# Patient Record
Sex: Male | Born: 1950 | Race: White | Hispanic: No | Marital: Married | Smoking: Current every day smoker
Health system: Southern US, Community
[De-identification: ages and names within clinical notes are randomized; demographics above are authoritative.]

## PROBLEM LIST (undated history)

## (undated) DIAGNOSIS — G2581 Restless legs syndrome: Secondary | ICD-10-CM

## (undated) DIAGNOSIS — E785 Hyperlipidemia, unspecified: Secondary | ICD-10-CM

## (undated) DIAGNOSIS — K219 Gastro-esophageal reflux disease without esophagitis: Secondary | ICD-10-CM

## (undated) DIAGNOSIS — C801 Malignant (primary) neoplasm, unspecified: Secondary | ICD-10-CM

## (undated) DIAGNOSIS — C4490 Unspecified malignant neoplasm of skin, unspecified: Secondary | ICD-10-CM

## (undated) DIAGNOSIS — E079 Disorder of thyroid, unspecified: Secondary | ICD-10-CM

## (undated) DIAGNOSIS — E039 Hypothyroidism, unspecified: Secondary | ICD-10-CM

## (undated) HISTORY — PX: TONSILLECTOMY: SUR1361

## (undated) HISTORY — PX: THYROID SURGERY: SHX805

## (undated) HISTORY — PX: OTHER SURGICAL HISTORY: SHX169

---

## 2005-04-21 ENCOUNTER — Ambulatory Visit: Payer: Self-pay | Admitting: Gastroenterology

## 2012-09-16 ENCOUNTER — Ambulatory Visit: Payer: Self-pay | Admitting: Emergency Medicine

## 2012-09-16 DIAGNOSIS — Z0000 Encounter for general adult medical examination without abnormal findings: Secondary | ICD-10-CM

## 2012-09-16 LAB — DOT URINE DIP
Blood: NEGATIVE
Glucose,UR: NEGATIVE mg/dL (ref 0–75)
Protein: NEGATIVE
Specific Gravity: 1.02 (ref 1.003–1.030)

## 2013-02-13 ENCOUNTER — Emergency Department: Payer: Self-pay | Admitting: Emergency Medicine

## 2013-02-28 ENCOUNTER — Emergency Department: Payer: Self-pay | Admitting: Emergency Medicine

## 2013-07-21 ENCOUNTER — Ambulatory Visit: Payer: Self-pay

## 2013-12-29 ENCOUNTER — Ambulatory Visit: Payer: Self-pay

## 2013-12-29 LAB — DOT URINE DIP
BLOOD: NEGATIVE
Glucose,UR: NEGATIVE
Protein: NEGATIVE
Specific Gravity: 1.02 (ref 1.000–1.030)

## 2014-02-26 ENCOUNTER — Ambulatory Visit: Payer: Self-pay | Admitting: Otolaryngology

## 2014-03-25 ENCOUNTER — Ambulatory Visit: Payer: Self-pay | Admitting: Otolaryngology

## 2014-04-08 ENCOUNTER — Ambulatory Visit: Payer: Self-pay | Admitting: Otolaryngology

## 2014-05-06 ENCOUNTER — Ambulatory Visit: Payer: Self-pay | Admitting: Internal Medicine

## 2014-06-15 LAB — SURGICAL PATHOLOGY

## 2014-06-21 NOTE — Op Note (Signed)
PATIENT NAME:  [name], [name] MR#:  [id number] DATE OF BIRTH:  25-Sep-1950  DATE OF PROCEDURE:  03/25/2014  PREOPERATIVE DIAGNOSES:  1. Left parotid neoplasm.  2. Left thyroid nodule.   POSTOPERATIVE DIAGNOSES:  1. Left parotid neoplasm.  2. Left thyroid nodule.   PROCEDURES: 1. Left superficial parotidectomy with facial nerve dissection and facial nerve monitoring.  2. Ultrasound-guided fine needle aspiration of left thyroid nodule.   SURGEON: Sammuel Hines. Richardson Landry, MD   FIRST ASSISTANT: Huey Romans, MD   ANESTHESIA: General endotracheal.   INDICATIONS: A 64 year old male with a left parotid mass with needle aspiration indicating probable benign pathology. He also had an incidental finding of a 1.3 cm left thyroid nodule with microcalcifications on his CT scan.   FINDINGS: The parotid mass was approximately 2 x 1.3 cm and located in the left tail of the parotid. No other nodules were seen. The left thyroid nodule was approximately 1.3 cm and successfully aspirated with fine needle under ultrasound guidance. Adequate tissue sampling was confirmed by the pathology tech.   COMPLICATIONS: None.   DESCRIPTION OF PROCEDURE: After obtaining informed consent, the patient was taken to the operating room and placed in the supine position. After induction of general endotracheal anesthesia, the patient was turned 90 degrees, and the facial nerve monitor electrodes were placed in the usual fashion and proper functioning confirmed. Lidocaine 1% with epinephrine 1:200,000 was injected into the skin along the proposed incision line. He was then prepped and draped in the usual sterile fashion. A 15 blade was used to incise the skin in a gentle S-shaped incision, going from anterior to the ear, around the earlobe, and curving gently into the neck. The incision was carried down to the subcutaneous fatty tissue with the Bovie. The sternocleidomastoid muscle was identified inferiorly and dissection proceeded  along the anterior border of the sternocleidomastoid, dissecting the parotid tissue away from the muscle and away from the region of the mastoid tip. A skin flap was then elevated above the SMAS using blunt scissors. The skin flap was retracted forward and was elevated just anterior to the palpable mass in the tail of parotid tissue. The parotid gland was then dissected away from the tragal cartilage and mastoid tip, monitoring facial nerve stimulation during the entire dissection. Dissection proceeded down to the region of the stylomastoid foramen, was adequately exposed, and careful dissection identified the trunk of the facial nerve. This was confirmed with a nerve stimulator. Sequential dissection out along the length of the facial nerve was then performed, identifying inferior nerve branches and dissecting them out under the mass and dividing the mass and superficial parotid tissue from the deeper lobe parotid tissue with the Harmonic scalpel. Care was taken throughout the dissection to identify the lower branches of the facial nerve prior and protecting prior to dividing any of the parotid tissue. Once the dissection had proceeded out beyond the mass itself, the parotid tissue was then divided anterior to the mass and sent as a specimen. There was no need to dissect the upper and middle facial nerve branches because of the mass being located so low in the tail of parotid. The wound was irrigated and the parotid fascia tacked back against the sternocleidomastoid muscle with 4-0 Vicryl to help close the dead space. A #10 TLS drain was placed through the skin and secured with 5-0 Prolene suture. The subcutaneous tissues were then closed with 4-0 Vicryl in an interrupted fashion, and the skin closed with 5-0 Prolene  suture in a running lock stitch. Bacitracin ointment was then applied.   Next, the neck was palpated. I can vaguely feel the mass on the left side of the thyroid gland, but not adequately enough to  accurately perform a needle aspiration. The skin was prepped with the ultrasound gel and the ultrasound used to identify the nodule within the parotid gland. The carotid was identified lateral to this. The nodule appeared to be about 1.3 cm in size. The nodule was then aspirated under ultrasound guidance with a 22-gauge needle. Three separate aspirations were made under direct ultrasound guidance. The tissue was given to the pathologist and inspected to confirm adequate tissue sampling. Follicular cells and colloid were seen, confirming adequacy of the specimen. Pressure was held over the area and there was minimal bruising. The patient was then returned to the anesthesiologist for awakening. He was awakened and taken to the recovery room in good condition postoperatively. At the completion of the skin closure, the drain was hooked up to a Hemovac tube for suction.   ESTIMATED BLOOD LOSS: Less than 50 mL.   ____________________________ Sammuel Hines. Richardson Landry, MD psb:mw D: 03/25/2014 10:04:22 ET T: 03/25/2014 13:15:29 ET JOB#: [id number]  cc: Sammuel Hines. Richardson Landry, MD, <Dictator> Riley Nearing MD ELECTRONICALLY SIGNED 04/22/2014 11:59

## 2014-06-21 NOTE — Op Note (Signed)
PATIENT NAME:  [name] [name], [name] [name] MR#:  [id number] DATE OF BIRTH:  Jun 17, 1950  DATE OF PROCEDURE:  04/08/2014  PREOPERATIVE DIAGNOSIS: Left thyroid nodule.   POSTOPERATIVE DIAGNOSIS: Left thyroid nodule.   PROCEDURE: Total thyroidectomy.   SURGEON:  Malon Kindle, MD  FIRST ASSISTANT: Beverly Gust, MD   ANESTHESIA: General endotracheal.   INDICATIONS: The patient with a history of left thyroid nodule noted incidentally on CT imaging after scan was ordered to evaluate a left parotid mass. The nodule had associated calcifications and fine needle aspiration was suspicious for papillary thyroid carcinoma. After discussion with the patient he has elected to proceed with total thyroidectomy to reduce any possible need for reoperation in the future.   FINDINGS: 13 mm firm nodule in the superior pole of the left thyroid gland. No other palpable nodules.   COMPLICATIONS: None.   DESCRIPTION OF PROCEDURE: After obtaining informed consent, the patient was taken to the operating room and placed in the supine position. After induction of general endotracheal anesthesia, the patient was placed on a shoulder roll with the neck extended and 1% lidocaine with epinephrine 1:100,000 injected into the skin overlying the thyroid gland. He was then prepped and draped in the usual sterile fashion. During intubation he was intubated with an endotracheal tube with the nerve monitor attached and I directly visualized electrode placement by the anesthesiologist to confirm it was in good position. A 15 blade was used to incise the skin and the incision carried down through the platysma with the Bovie. An anterior jugular vein was clamped and suture ligated and the strap muscles divided in the midline with the Bovie and retracted laterally. The left gland was dissected first, dissecting the strap muscles away from the thyroid gland and working laterally around the gland, dissecting soft tissue attachments and dividing using  the Harmonic scalpel. Dissection proceeded inferiorly dividing the inferior thyroid vascular pedicle with the Harmonic scalpel. A parathyroid gland was easily identified in this area and preserved with its vascular pedicle. Dissecting around laterally, the middle thyroid veins were divided with the Harmonic scalpel and dissection proceeded superiorly where the superior vascular pedicle was dissected out and divided with the Harmonic scalpel. This allowed the thyroid to be rotated medially and careful dissection revealed the recurrent laryngeal nerve. This was confirmed by stimulating the nerve. The nerve was then dissected up superiorly to where it entered the larynx, allowing the remainder of the thyroid gland to be dissected away from the trachea and Berry's ligament with the nerve directly visualized and protected. Attention then turned to the opposite side where the same procedure was performed, again dissecting the gland away from the strap muscles and surrounding structures using the Harmonic scalpel and dividing vascular attachments inferiorly and superiorly. During the dissection of the glands 3 parathyroid glands were identified, the 2 parathyroid glands on the left as well as a superior parathyroid on the right. I did not identify an inferior parathyroid gland, however, the specimen was inspected and there was no parathyroid tissue associated with it that could be visualized. There was most likely some parathyroid remaining in an area of fatty tissue in the region of the lower pole of the right gland. The right gland was dissected away from the trachea and Berry's ligament divided after identifying the recurrent laryngeal nerve and tracing it up to its entrance into the larynx. The full specimen was removed and sent with the superior pole of the left thyroid gland marked with a stitch where the nodule  was located. The nodule itself appeared to be confined to the thyroid gland. There was no evidence of any  peritracheal lymphadenopathy. The wound was irrigated and minor bleeding controlled with the bipolar cautery. The recurrent laryngeal nerves were restimulated and appeared to function appropriately. A small amount of Surgicel was placed over the region of Berry's ligament bilaterally where there was some minimal oozing.  Number 10 TLS drains were then placed through the skin and sutured into position with the 4-0 Vicryl suture. The strap muscles were reapproximated with 4-0 Vicryl. The subcutaneous tissues were then closed with 4-0 Vicryl in an interrupted fashion. The skin was closed with 3-0 Prolene suture in a running subcuticular stitch. The drains were hooked up to their Vacu-tube suctions and the patient returned to the anesthesiologist for awakening. He was awakened and taken to the recovery room in good condition postoperatively. Blood loss was less than 25 mL.   ____________________________ Sammuel Hines. Richardson Landry, MD psb:mc D: 04/09/2014 08:11:08 ET T: 04/09/2014 08:33:09 ET JOB#: [id number]  cc: Sammuel Hines. Richardson Landry, MD, <Dictator> Riley Nearing MD ELECTRONICALLY SIGNED 04/22/2014 12:00

## 2015-01-28 ENCOUNTER — Encounter: Payer: Self-pay | Admitting: Emergency Medicine

## 2015-01-28 ENCOUNTER — Ambulatory Visit
Admission: EM | Admit: 2015-01-28 | Discharge: 2015-01-28 | Disposition: A | Discharge status: Home / Self Care | Payer: PRIVATE HEALTH INSURANCE | Attending: Family Medicine | Admitting: Family Medicine

## 2015-01-28 DIAGNOSIS — Z024 Encounter for examination for driving license: Secondary | ICD-10-CM

## 2015-01-28 DIAGNOSIS — Z021 Encounter for pre-employment examination: Secondary | ICD-10-CM

## 2015-01-28 HISTORY — DX: Disorder of thyroid, unspecified: E07.9

## 2015-01-28 HISTORY — DX: Restless legs syndrome: G25.81

## 2015-01-28 LAB — DEPT OF TRANSP DIPSTICK, URINE (ARMC ONLY)
Glucose, UA: NEGATIVE mg/dL
Hgb urine dipstick: NEGATIVE
Protein, ur: NEGATIVE mg/dL
Specific Gravity, Urine: 1.025 (ref 1.005–1.030)

## 2015-01-28 NOTE — ED Notes (Signed)
Photocopy of Drivers License illegible to see expiration date. Patient informed that needs to go to the [facility] to obtain a new/legible DL. Will return after goes to [facility] in [place]

## 2015-01-28 NOTE — ED Provider Notes (Signed)
CSN: [id number]     Arrival date & time 01/28/15  1401 History   First MD Initiated Contact with Patient 01/28/15 1423    Nurses notes were reviewed. Chief Complaint  Patient presents with  . 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 Patient had a history of thyroidectomy in the past year and taking thyroid replacement medications time.  Case because his bradycardia he had last year. Underwent cardiac examination evaluation was found to be okay    (Consider location/radiation/quality/duration/timing/severity/associated sxs/prior Treatment) HPI  Past Medical History  Diagnosis Date  . Thyroid disease   . Restless leg    Past Surgical History  Procedure Laterality Date  . Thyroid surgery     History reviewed. No pertinent family history. Social History  Substance Use Topics  . Smoking status: Current Every Day Smoker  . Smokeless tobacco: Never Used  . Alcohol Use: Yes    Review of Systems  All other systems reviewed and are negative.   Allergies  Review of patient's allergies indicates no known allergies.  Home Medications   Prior to Admission medications   Medication Sig Start Date End Date Taking? Authorizing Provider  rOPINIRole (REQUIP) 0.25 MG tablet Take 0.25 mg by mouth once.   Yes Historical Provider, MD   Meds Ordered and Administered this Visit  Medications - No data to display  BP 117/67 mmHg  Pulse 61  Temp(Src) 97.4 F (36.3 C) (Tympanic)  Resp 18  Ht 6\' 2"  (1.88 m)  Wt 200 lb (90.719 kg)  BMI 25.67 kg/m2  SpO2 100% No data found.   Physical Exam  Constitutional: He appears well-developed and well-nourished.  HENT:  Head: Normocephalic and atraumatic.  Eyes: Conjunctivae are normal. Pupils are equal, round, and reactive to light.  Neck: Normal range of motion. Neck supple.  Cardiovascular: Normal rate, regular rhythm and normal heart sounds.   Pulmonary/Chest: Effort normal and breath sounds normal. No respiratory distress.   Abdominal: Soft. There is no hepatosplenomegaly. There is no tenderness. There is no CVA tenderness. No hernia. Hernia confirmed negative in the ventral area.  Genitourinary: Penis normal.  Musculoskeletal: Normal range of motion.  Neurological: He is alert. He has normal reflexes.  Skin: Skin is warm and dry. No erythema.  Vitals reviewed.   ED Course  Procedures (including critical care time)  Labs Review Labs Reviewed  DEPT OF TRANSP DIPSTICK, URINE([facility] ONLY)   Results for orders placed or performed during the hospital encounter of 01/28/15  Dept of Transp dipstick, urine  Result Value Ref Range   Protein, ur NEGATIVE NEGATIVE mg/dL   Glucose, UA NEGATIVE NEGATIVE mg/dL   Specific Gravity, Urine 1.025 1.005 - 1.030   Hgb urine dipstick NEGATIVE NEGATIVE   Imaging Review No results found.   Visual Acuity Review  Right Eye Distance: 20/15  corrected Left Eye Distance: 20/20  corrected Bilateral Distance: 20/15  corrected  Right Eye Near:   Left Eye Near:    Bilateral Near:         MDM   1. 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    Frederich Cha, MD 01/28/15 [phone]

## 2015-01-28 NOTE — ED Notes (Signed)
Patient here for DOT Physical.  

## 2015-04-02 ENCOUNTER — Encounter: Payer: Self-pay | Admitting: *Deleted

## 2015-04-05 ENCOUNTER — Encounter
Admission: RE | Disposition: A | Discharge status: Home / Self Care | Payer: Self-pay | Source: Ambulatory Visit | Attending: Gastroenterology

## 2015-04-05 ENCOUNTER — Ambulatory Visit
Admission: RE | Admit: 2015-04-05 | Discharge: 2015-04-05 | Disposition: A | Discharge status: Home / Self Care | Payer: BLUE CROSS/BLUE SHIELD | Source: Ambulatory Visit | Attending: Gastroenterology | Admitting: Gastroenterology

## 2015-04-05 ENCOUNTER — Ambulatory Visit: Payer: BLUE CROSS/BLUE SHIELD | Admitting: Anesthesiology

## 2015-04-05 DIAGNOSIS — F17210 Nicotine dependence, cigarettes, uncomplicated: Secondary | ICD-10-CM | POA: Diagnosis not present

## 2015-04-05 DIAGNOSIS — Z85828 Personal history of other malignant neoplasm of skin: Secondary | ICD-10-CM | POA: Insufficient documentation

## 2015-04-05 DIAGNOSIS — Z79899 Other long term (current) drug therapy: Secondary | ICD-10-CM | POA: Insufficient documentation

## 2015-04-05 DIAGNOSIS — E039 Hypothyroidism, unspecified: Secondary | ICD-10-CM | POA: Diagnosis not present

## 2015-04-05 DIAGNOSIS — K5730 Diverticulosis of large intestine without perforation or abscess without bleeding: Secondary | ICD-10-CM | POA: Diagnosis not present

## 2015-04-05 DIAGNOSIS — Z1211 Encounter for screening for malignant neoplasm of colon: Secondary | ICD-10-CM | POA: Insufficient documentation

## 2015-04-05 HISTORY — PX: COLONOSCOPY WITH PROPOFOL: SHX5780

## 2015-04-05 HISTORY — DX: Malignant (primary) neoplasm, unspecified: C80.1

## 2015-04-05 HISTORY — DX: Hypothyroidism, unspecified: E03.9

## 2015-04-05 HISTORY — DX: Hyperlipidemia, unspecified: E78.5

## 2015-04-05 HISTORY — DX: Unspecified malignant neoplasm of skin, unspecified: C44.90

## 2015-04-05 HISTORY — DX: Gastro-esophageal reflux disease without esophagitis: K21.9

## 2015-04-05 SURGERY — COLONOSCOPY WITH PROPOFOL
Anesthesia: General

## 2015-04-05 MED ORDER — LIDOCAINE HCL (CARDIAC) 20 MG/ML IV SOLN
INTRAVENOUS | Status: DC | PRN
  Administered 2015-04-05: 30 mg via INTRAVENOUS

## 2015-04-05 MED ORDER — PROPOFOL 10 MG/ML IV BOLUS
INTRAVENOUS | Status: DC | PRN
  Administered 2015-04-05: 100 mg via INTRAVENOUS

## 2015-04-05 MED ORDER — SODIUM CHLORIDE 0.9 % IV SOLN
INTRAVENOUS | Status: DC
  Administered 2015-04-05: 1000 mL via INTRAVENOUS

## 2015-04-05 MED ORDER — PROPOFOL 500 MG/50ML IV EMUL
INTRAVENOUS | Status: DC | PRN
  Administered 2015-04-05: 100 ug/kg/min via INTRAVENOUS

## 2015-04-05 MED ORDER — SODIUM CHLORIDE 0.9 % IV SOLN: INTRAVENOUS | Status: DC

## 2015-04-05 MED ORDER — EPHEDRINE SULFATE 50 MG/ML IJ SOLN
INTRAMUSCULAR | Status: DC | PRN
  Administered 2015-04-05: 10 mg via INTRAVENOUS

## 2015-04-05 NOTE — Anesthesia Preprocedure Evaluation (Addendum)
Anesthesia Evaluation  Patient identified by MRN, date of birth, ID band Patient awake    Reviewed: Allergy & Precautions, H&P , NPO status , Patient's Chart, lab work & pertinent test results, reviewed documented beta blocker date and time   History of Anesthesia Complications Negative for: history of anesthetic complications  Airway Mallampati: I  TM Distance: >3 FB Neck ROM: full    Dental no notable dental hx. (+) Missing, Poor Dentition   Pulmonary neg shortness of breath, neg sleep apnea, neg COPD, Recent URI , Residual Cough, Current Smoker,    Pulmonary exam normal breath sounds clear to auscultation       Cardiovascular Exercise Tolerance: Good negative cardio ROS Normal cardiovascular exam Rhythm:regular Rate:Normal     Neuro/Psych negative neurological ROS  negative psych ROS   GI/Hepatic Neg liver ROS, GERD  ,  Endo/Other  neg diabetesHypothyroidism   Renal/GU negative Renal ROS  negative genitourinary   Musculoskeletal   Abdominal   Peds  Hematology negative hematology ROS (+)   Anesthesia Other Findings Past Medical History:   Thyroid disease                                              Restless leg                                                 GERD (gastroesophageal reflux disease)                       Lipids blood increased                                       Hypothyroidism                                               Cancer (HCC)                                                   Comment:thyroid   Skin cancer                                                  Reproductive/Obstetrics negative OB ROS                            Anesthesia Physical Anesthesia Plan  ASA: II  Anesthesia Plan: General   Post-op Pain Management:    Induction:   Airway Management Planned:   Additional Equipment:   Intra-op Plan:   Post-operative Plan:   Informed Consent: I  have reviewed the patients History and Physical, chart, labs and discussed the procedure including the risks, benefits and alternatives for the proposed anesthesia with  the patient or authorized representative who has indicated his/her understanding and acceptance.   Dental Advisory Given  Plan Discussed with: Anesthesiologist, CRNA and Surgeon  Anesthesia Plan Comments:         Anesthesia Quick Evaluation

## 2015-04-05 NOTE — Anesthesia Postprocedure Evaluation (Signed)
Anesthesia Post Note  Patient: [name] [name]  Procedure(s) Performed: Procedure(s) (LRB): COLONOSCOPY WITH PROPOFOL (N/A)  Patient location during evaluation: Endoscopy Anesthesia Type: General Level of consciousness: awake and alert Pain management: pain level controlled Vital Signs Assessment: post-procedure vital signs reviewed and stable Respiratory status: spontaneous breathing, nonlabored ventilation, respiratory function stable and patient connected to nasal cannula oxygen Cardiovascular status: blood pressure returned to baseline and stable Postop Assessment: no signs of nausea or vomiting Anesthetic complications: no    Last Vitals:  Filed Vitals:   04/05/15 1114 04/05/15 1125  BP: 97/70 110/79  Pulse: 50 47  Temp:    Resp: 13 18    Last Pain: There were no vitals filed for this visit.               Martha Clan

## 2015-04-05 NOTE — Transfer of Care (Signed)
Immediate Anesthesia Transfer of Care Note  Patient: [name] [name]  Procedure(s) Performed: Procedure(s): COLONOSCOPY WITH PROPOFOL (N/A)  Patient Location: Endoscopy Unit  Anesthesia Type:General  Level of Consciousness: sedated  Airway & Oxygen Therapy: Patient Spontanous Breathing and Patient connected to nasal cannula oxygen  Post-op Assessment: Report given to RN and Post -op Vital signs reviewed and stable  Post vital signs: Reviewed and stable  Last Vitals:  Filed Vitals:   04/05/15 0943  BP: 127/70  Pulse: 49  Temp: 36.2 C  Resp: 16    Complications: No apparent anesthesia complications

## 2015-04-05 NOTE — H&P (Signed)
    Primary Care Physician:  Maryland Pink, MD Primary Gastroenterologist:  Dr. Candace Cruise  Pre-Procedure History & Physical: HPI:  [name] [name] is a 65 y.o. male is here for an colonoscopy.   Past Medical History  Diagnosis Date  . Thyroid disease   . Restless leg   . GERD (gastroesophageal reflux disease)   . Lipids blood increased   . Hypothyroidism   . Cancer ([place])     thyroid  . Skin cancer     Past Surgical History  Procedure Laterality Date  . Thyroid surgery    . Throidectomy    . Tonsillectomy    . Varcose veins      Prior to Admission medications   Medication Sig Start Date End Date Taking? Authorizing Provider  levothyroxine (SYNTHROID, LEVOTHROID) 100 MCG tablet Take 100 mcg by mouth daily before breakfast.   Yes Historical Provider, MD  liothyronine (CYTOMEL) 5 MCG tablet Take 5 mcg by mouth daily.   Yes Historical Provider, MD  Nutritional Supplements (FEEDING SUPPLEMENT, OSMOLITE 1.5 CAL,) LIQD Place 1,000 mLs into feeding tube continuous.   Yes Historical Provider, MD  omega-3 acid ethyl esters (LOVAZA) 1 g capsule Take by mouth 2 (two) times daily.   Yes Historical Provider, MD  omeprazole (PRILOSEC) 20 MG capsule Take 20 mg by mouth daily.   Yes Historical Provider, MD  rOPINIRole (REQUIP) 0.25 MG tablet Take 0.25 mg by mouth once.    Historical Provider, MD    Allergies as of 03/30/2015  . (No Known Allergies)    History reviewed. No pertinent family history.  Social History   Social History  . Marital Status: Married    Spouse Name: N/A  . Number of Children: N/A  . Years of Education: N/A   Occupational History  . Not on file.   Social History Main Topics  . Smoking status: Current Every Day Smoker -- 0.25 packs/day    Types: Cigarettes  . Smokeless tobacco: Never Used  . Alcohol Use: Yes  . Drug Use: No  . Sexual Activity: Not Currently   Other Topics Concern  . Not on file   Social History Narrative    Review of Systems: See  HPI, otherwise negative ROS  Physical Exam: BP 127/70 mmHg  Pulse 49  Temp(Src) 97.2 F (36.2 C) (Tympanic)  Resp 16  Ht 6\' 2"  (1.88 m)  Wt 90.719 kg (200 lb)  BMI 25.67 kg/m2  SpO2 100% General:   Alert,  pleasant and cooperative in NAD Head:  Normocephalic and atraumatic. Neck:  Supple; no masses or thyromegaly. Lungs:  Clear throughout to auscultation.    Heart:  Regular rate and rhythm. Abdomen:  Soft, nontender and nondistended. Normal bowel sounds, without guarding, and without rebound.   Neurologic:  Alert and  oriented x4;  grossly normal neurologically.  Impression/Plan: [name] is here for an {colonoscopy to be performed for screening. Risks, benefits, limitations, and alternatives regarding  colonoscopy have been reviewed with the patient.  Questions have been answered.  All parties agreeable.   Hal Norrington, Lupita Dawn, MD  04/05/2015, 10:02 AM

## 2015-04-05 NOTE — Op Note (Signed)
[facility] Gastroenterology Patient Name: [name] [name] Procedure Date: 04/05/2015 10:35 AM MRN: [id number] Account #: [id number] Date of Birth: 1950/05/17 Admit Type: Outpatient Age: 65 Room: [facility] ENDO ROOM 4 Gender: Male Note Status: Finalized Procedure:         Colonoscopy Indications:       Screening for colorectal malignant neoplasm Providers:         Lupita Dawn. Candace Cruise, MD Referring MD:      Irven Easterly. Kary Kos, MD (Referring MD) Medicines:         Monitored Anesthesia Care Complications:     No immediate complications. Procedure:         Pre-Anesthesia Assessment:                    - Prior to the procedure, a History and Physical was                     performed, and patient medications, allergies and                     sensitivities were reviewed. The patient's tolerance of                     previous anesthesia was reviewed.                    - The risks and benefits of the procedure and the sedation                     options and risks were discussed with the patient. All                     questions were answered and informed consent was obtained.                    - After reviewing the risks and benefits, the patient was                     deemed in satisfactory condition to undergo the procedure.                    After obtaining informed consent, the colonoscope was                     passed under direct vision. Throughout the procedure, the                     patient's blood pressure, pulse, and oxygen saturations                     were monitored continuously. The Colonoscope was                     introduced through the anus and advanced to the the cecum,                     identified by appendiceal orifice and ileocecal valve. The                     colonoscopy was performed without difficulty. The patient                     tolerated the procedure well. The quality of the bowel  preparation was good. Findings:      A  few small-mouthed diverticula were found in the sigmoid colon.      The exam was otherwise without abnormality. Impression:        - Diverticulosis in the sigmoid colon.                    - The examination was otherwise normal.                    - No specimens collected. Recommendation:    - Discharge patient to home.                    - Repeat colonoscopy in 10 years for surveillance.                    - The findings and recommendations were discussed with the                     patient. Procedure Code(s): --- Professional ---                    [phone], Colonoscopy, flexible; diagnostic, including                     collection of specimen(s) by brushing or washing, when                     performed (separate procedure) Diagnosis Code(s): --- Professional ---                    Z12.11, Encounter for screening for malignant neoplasm of                     colon                    K57.30, Diverticulosis of large intestine without                     perforation or abscess without bleeding CPT copyright 2016 [employer]. All rights reserved. The codes documented in this report are preliminary and upon coder review may  be revised to meet current compliance requirements. Hulen Luster, MD 04/05/2015 10:57:03 AM This report has been signed electronically. Number of Addenda: 0 Note Initiated On: 04/05/2015 10:35 AM Scope Withdrawal Time: 0 hours 6 minutes 9 seconds  Total Procedure Duration: 0 hours 12 minutes 24 seconds       [facility]

## 2015-04-07 ENCOUNTER — Encounter: Payer: Self-pay | Admitting: Gastroenterology

## 2016-01-11 ENCOUNTER — Encounter: Payer: Self-pay | Admitting: Unknown Physician Specialty

## 2016-01-19 ENCOUNTER — Encounter: Payer: Self-pay | Admitting: Unknown Physician Specialty

## 2016-03-04 ENCOUNTER — Ambulatory Visit
Admission: EM | Admit: 2016-03-04 | Discharge: 2016-03-04 | Disposition: A | Discharge status: Home / Self Care | Payer: BLUE CROSS/BLUE SHIELD | Attending: Emergency Medicine | Admitting: Emergency Medicine

## 2016-03-04 DIAGNOSIS — L0291 Cutaneous abscess, unspecified: Secondary | ICD-10-CM | POA: Diagnosis not present

## 2016-03-04 MED ORDER — HYDROCODONE-ACETAMINOPHEN 5-325 MG PO TABS: 1.0000 | ORAL_TABLET | Freq: Four times a day (QID) | ORAL | 0 refills | Status: AC | PRN

## 2016-03-04 MED ORDER — CEFTRIAXONE SODIUM 1 G IJ SOLR
1.0000 g | Freq: Once | INTRAMUSCULAR | Status: AC
  Administered 2016-03-04: 1 g via INTRAMUSCULAR

## 2016-03-04 MED ORDER — CEFTRIAXONE SODIUM 1 G IJ SOLR: 1.0000 g | Freq: Once | INTRAMUSCULAR | Status: DC

## 2016-03-04 MED ORDER — SULFAMETHOXAZOLE-TRIMETHOPRIM 800-160 MG PO TABS: 1.0000 | ORAL_TABLET | Freq: Two times a day (BID) | ORAL | 0 refills | Status: AC

## 2016-03-04 MED ORDER — MUPIROCIN 2 % EX OINT: 1.0000 "application " | TOPICAL_OINTMENT | Freq: Three times a day (TID) | CUTANEOUS | 0 refills | Status: AC

## 2016-03-04 NOTE — ED Triage Notes (Signed)
Pt with peri rectal abscess x past several days. Started draining dark substance today. Pain 8/10

## 2016-03-04 NOTE — Discharge Instructions (Signed)
Keep the surgical area clean and dry. Use ibuprofen for pain. May use of Vicodin for severe pain especially to help sleep. Take Septra beginning at 6 PM tonight. Follow-up tomorrow for wound recheck and repacking.

## 2016-03-04 NOTE — ED Provider Notes (Signed)
CSN: [id number]     Arrival date & time 03/04/16  1047 History   First MD Initiated Contact with Patient 03/04/16 1238     Chief Complaint  Patient presents with  . Abscess   (Consider location/radiation/quality/duration/timing/severity/associated sxs/prior Treatment) HPI  This a 66 year old gentleman who is accompanied by his wife who complains of a abscess in his perineum. Noticed for the last couple of days. 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 This morning while taking a shower it started to drain some dark fluid so he came in for evaluation. She has chills over the last few days but has not had any fever although he never measured. 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       Past Medical History:  Diagnosis Date  . Cancer ([place])    thyroid  . GERD (gastroesophageal reflux disease)   . Hypothyroidism   . Lipids blood increased   . Restless leg   . Skin cancer   . Thyroid disease    Past Surgical History:  Procedure Laterality Date  . COLONOSCOPY WITH PROPOFOL N/A 04/05/2015   Procedure: COLONOSCOPY WITH PROPOFOL;  Surgeon: Hulen Luster, MD;  Location: [facility] ENDOSCOPY;  Service: Gastroenterology;  Laterality: N/A;  . throidectomy    . THYROID SURGERY    . TONSILLECTOMY    . varcose veins     History reviewed. No pertinent family history. Social History  Substance Use Topics  . Smoking status: Current Every Day Smoker    Packs/day: 0.25    Types: Cigarettes  . Smokeless tobacco: Never Used  . Alcohol use Yes    Review of Systems  Constitutional: Positive for activity change and chills. Negative for fatigue and fever.  Genitourinary: Negative for penile pain, penile swelling, scrotal swelling and testicular pain.  Skin: Positive for wound.  All other systems reviewed and are negative.   Allergies  Bee venom  Home Medications   Prior to Admission medications    Medication Sig Start Date End Date Taking? Authorizing Provider  HYDROcodone-acetaminophen (NORCO/VICODIN) 5-325 MG tablet Take 1-2 tablets by mouth every 6 (six) hours as needed. 03/04/16   Lorin Picket, PA-C  levothyroxine (SYNTHROID, LEVOTHROID) 100 MCG tablet Take 100 mcg by mouth daily before breakfast.    Historical Provider, MD  mupirocin ointment (BACTROBAN) 2 % Apply 1 application topically 3 (three) times daily. 03/04/16   Lorin Picket, PA-C  omeprazole (PRILOSEC) 20 MG capsule Take 20 mg by mouth daily.    Historical Provider, MD  rOPINIRole (REQUIP) 0.25 MG tablet Take 0.25 mg by mouth once.    Historical Provider, MD  sulfamethoxazole-trimethoprim (BACTRIM DS,SEPTRA DS) 800-160 MG tablet Take 1 tablet by mouth 2 (two) times daily. 03/04/16   Lorin Picket, PA-C   Meds Ordered and Administered this Visit   Medications  cefTRIAXone (ROCEPHIN) injection 1 g (1 g Intramuscular Given 03/04/16 1325)    BP 136/78 (BP Location: Left Arm)   Pulse (!) 55   Temp 98.3 F (36.8 C) (Oral)   Resp 16   Ht 6\' 2"  (1.88 m)   Wt 205 lb (93 kg)   SpO2 98%   BMI 26.32 kg/m  No data found.   Physical Exam  Constitutional: He is oriented to person, place, and time. He appears well-developed and well-nourished. No distress.  HENT:  Head: Normocephalic and atraumatic.  Eyes:  EOM are normal. Pupils are equal, round, and reactive to light. Right eye exhibits no discharge. Left eye exhibits no discharge.  Neck: Normal range of motion. Neck supple.  Musculoskeletal: Normal range of motion.  Neurological: He is alert and oriented to person, place, and time.  Skin: Skin is warm and dry. He is not diaphoretic. There is erythema.  Examination of the perineum shows a large abscess of the gluteal cleft with induration standing towards the scrotum and not involving the scrotum. The indurated area is approximately a 2-1/2 cm in length and approximately 1 cm in width. At the posterior more most  portion of it there is a open area that is draining serosanguineous material. The abscess does not the anus. There is a is erythematous blanchable area extending beyond the abscess to the thigh approximately 4 cm.   Psychiatric: He has a normal mood and affect. His behavior is normal. Judgment and thought content normal.  Nursing note and vitals reviewed.   Urgent Care Course   Clinical Course     .Marland KitchenIncision and Drainage Date/Time: 03/04/2016 1:52 PM Performed by: Lorin Picket Authorized by: Melynda Ripple   Consent:    Consent obtained:  Verbal   Consent given by:  Patient   Risks discussed:  Infection and pain   Alternatives discussed:  Alternative treatment and referral Location:    Type:  Abscess   Size:  2.5x1 cm induration with depth of .75 cm with loculation   Location:  Anogenital   Anogenital location:  Perineum Pre-procedure details:    Skin preparation:  Betadine Anesthesia (see MAR for exact dosages):    Anesthesia method:  Local infiltration   Local anesthetic:  Lidocaine 1% w/o epi Procedure type:    Complexity:  Complex Procedure details:    Needle aspiration: no     Incision types:  Stab incision   Incision depth:  Subcutaneous   Scalpel blade:  11   Wound management:  Probed and deloculated   Drainage:  Serosanguinous   Drainage amount:  Moderate   Wound treatment:  Drain placed and wound left open   Packing materials:  1/4 in gauze   Amount 1/4":  3 inches Post-procedure details:    Patient tolerance of procedure:  Tolerated well, no immediate complications Comments:     Patient will continue clean and dry. Return tomorrow for removal of the drain and repacking. He was given 1 g of Rocephin today IM and start taking Septra DS tonight 6 PM. Given 5 days of antibiotic.    (including critical care time)  Labs Review Labs Reviewed  AEROBIC CULTURE (SUPERFICIAL SPECIMEN)    Imaging Review No results found.   Visual Acuity  Review  Right Eye Distance:   Left Eye Distance:   Bilateral Distance:    Right Eye Near:   Left Eye Near:    Bilateral Near:    Medications  cefTRIAXone (ROCEPHIN) injection 1 g (1 g Intramuscular Given 03/04/16 1325)       MDM   1. Abscess    Discharge Medication List as of 03/04/2016  1:29 PM    START taking these medications   Details  HYDROcodone-acetaminophen (NORCO/VICODIN) 5-325 MG tablet Take 1-2 tablets by mouth every 6 (six) hours as needed., Starting Sat 03/04/2016, Print    mupirocin ointment (BACTROBAN) 2 % Apply 1 application topically 3 (three) times daily., Starting Sat 03/04/2016, Normal    sulfamethoxazole-trimethoprim (BACTRIM DS,SEPTRA DS) 800-160 MG tablet Take 1 tablet by mouth 2 (  two) times daily., Starting Sat 03/04/2016, Normal      Plan: 1. Test/x-ray results and diagnosis reviewed with patient 2. rx as per orders; risks, benefits, potential side effects reviewed with patient 3. Recommend supportive treatment with Keeping clean and dry. Ibuprofen for pain and use Vicodin for severe pain particularly for sleep. Follow-up tomorrow for removal of packing and repacking. 4. F/u prn if symptoms worsen or don't improve     Lorin Picket, PA-C 03/04/16 1358

## 2016-03-05 ENCOUNTER — Ambulatory Visit
Admission: EM | Admit: 2016-03-05 | Discharge: 2016-03-05 | Disposition: A | Discharge status: Home / Self Care | Payer: BLUE CROSS/BLUE SHIELD | Attending: Emergency Medicine | Admitting: Emergency Medicine

## 2016-03-05 DIAGNOSIS — L02215 Cutaneous abscess of perineum: Secondary | ICD-10-CM

## 2016-03-05 NOTE — ED Triage Notes (Signed)
Pt is here for a follow up on an abscess that was drained yesterday.

## 2016-03-05 NOTE — ED Provider Notes (Signed)
CSN: [id number]     Arrival date & time 03/05/16  [id number] History   None    Chief Complaint  Patient presents with  . Follow-up   (Consider location/radiation/quality/duration/timing/severity/associated sxs/prior Treatment) HPI  Patient returns today as requested following an I&D of perineal abscess yesterday. He states that that he change the mini pad over the wound and inadvertently pulled the drain out significantly. However it still remains a small amount in the opening. He states that he is much improved has very little discomfort. He has been taking his Septra and has been applying the actor band ointment on the periphery although he was not instructed to do that. He is afebrile today.      Past Medical History:  Diagnosis Date  . Cancer ([place])    thyroid  . GERD (gastroesophageal reflux disease)   . Hypothyroidism   . Lipids blood increased   . Restless leg   . Skin cancer   . Thyroid disease    Past Surgical History:  Procedure Laterality Date  . COLONOSCOPY WITH PROPOFOL N/A 04/05/2015   Procedure: COLONOSCOPY WITH PROPOFOL;  Surgeon: Hulen Luster, MD;  Location: [facility] ENDOSCOPY;  Service: Gastroenterology;  Laterality: N/A;  . throidectomy    . THYROID SURGERY    . TONSILLECTOMY    . varcose veins     History reviewed. No pertinent family history. Social History  Substance Use Topics  . Smoking status: Current Every Day Smoker    Packs/day: 0.25    Types: Cigarettes  . Smokeless tobacco: Never Used  . Alcohol use Yes    Review of Systems  Constitutional: Positive for activity change. Negative for chills, fatigue and fever.  All other systems reviewed and are negative.   Allergies  Bee venom  Home Medications   Prior to Admission medications   Medication Sig Start Date End Date Taking? Authorizing Provider  HYDROcodone-acetaminophen (NORCO/VICODIN) 5-325 MG tablet Take 1-2 tablets by mouth every 6 (six) hours as needed. 03/04/16  Yes Lorin Picket, PA-C   levothyroxine (SYNTHROID, LEVOTHROID) 100 MCG tablet Take 100 mcg by mouth daily before breakfast.   Yes Historical Provider, MD  mupirocin ointment (BACTROBAN) 2 % Apply 1 application topically 3 (three) times daily. 03/04/16  Yes Lorin Picket, PA-C  omeprazole (PRILOSEC) 20 MG capsule Take 20 mg by mouth daily.   Yes Historical Provider, MD  rOPINIRole (REQUIP) 0.25 MG tablet Take 0.25 mg by mouth once.   Yes Historical Provider, MD  sulfamethoxazole-trimethoprim (BACTRIM DS,SEPTRA DS) 800-160 MG tablet Take 1 tablet by mouth 2 (two) times daily. 03/04/16  Yes Lorin Picket, PA-C   Meds Ordered and Administered this Visit  Medications - No data to display  BP 134/87 (BP Location: Right Arm)   Pulse 62   Temp 97.8 F (36.6 C) (Oral)   Resp 18   Ht 6\' 2"  (1.88 m)   Wt 205 lb (93 kg)   SpO2 100%   BMI 26.32 kg/m  No data found.   Physical Exam  Constitutional: He is oriented to person, place, and time. He appears well-developed and well-nourished. No distress.  HENT:  Head: Normocephalic and atraumatic.  Eyes: EOM are normal. Pupils are equal, round, and reactive to light. Right eye exhibits no discharge. Left eye exhibits no discharge.  Neck: Normal range of motion. Neck supple.  Genitourinary:  Genitourinary Comments: Examination of the perineal abscess shows the range gauze to be intact. This was removed without incident. No purulence was  able to be expressed from the wound itself. The indurated area which is parked there with a surgical marker yesterday shows some resolution. The remainder of the abscess cavity remains soft.  Musculoskeletal: Normal range of motion.  Neurological: He is alert and oriented to person, place, and time.  Skin: Skin is warm and dry. He is not diaphoretic.  Nursing note and vitals reviewed.   Urgent Care Course   Clinical Course     Procedures (including critical care time)  Labs Review Labs Reviewed - No data to display  Imaging  Review No results found.   Visual Acuity Review  Right Eye Distance:   Left Eye Distance:   Bilateral Distance:    Right Eye Near:   Left Eye Near:    Bilateral Near:     Procedure: After the gauze was removed the area was gently cleansed. A new quarter inch gauze packing was then placed loosely into the abscess cavity. Approximately inch and a half of gauze was utilized. Following this an Adaptic gauze was placed over top of the wick in order to prevent inadvertent removal. A feminine napkin was placed over the Adaptic to catch any drainage that may occur. Patient tolerated procedure well no anesthesia was required.    MDM   1. Abscess, perineum    The patient was reminded of the need to continue taking his oral medications. He will follow-up in 2 days for probable discontinuation of the packing and possible repacking or consideration for sitz bath therapy if the abscess is improving. He develops a fever has increased discharge from the wound or becomes more painful he should return immediately    Lorin Picket, PA-C 03/05/16 0935

## 2016-03-07 ENCOUNTER — Ambulatory Visit
Admission: EM | Admit: 2016-03-07 | Discharge: 2016-03-07 | Disposition: A | Discharge status: Home / Self Care | Payer: BLUE CROSS/BLUE SHIELD | Attending: Emergency Medicine | Admitting: Emergency Medicine

## 2016-03-07 DIAGNOSIS — L0291 Cutaneous abscess, unspecified: Secondary | ICD-10-CM

## 2016-03-07 LAB — AEROBIC CULTURE W GRAM STAIN (SUPERFICIAL SPECIMEN): Culture: NO GROWTH

## 2016-03-07 LAB — AEROBIC CULTURE  (SUPERFICIAL SPECIMEN)

## 2016-03-07 NOTE — ED Triage Notes (Signed)
Patient is here for recheck for abscess. Patient states that some of the packing came lose (Patient states that 2 inches came out). Patient states that area has been doing well.

## 2016-03-07 NOTE — ED Provider Notes (Signed)
CSN: [id number]     Arrival date & time 03/07/16  [id number] History   None    Chief Complaint  Patient presents with  . Abscess   (Consider location/radiation/quality/duration/timing/severity/associated sxs/prior Treatment) HPI  Patient returns today for a follow-up of the perineal abscess. He said the drain had been pulled out when he changed the dressing. It is still feeling much improvement. He feels some pressure but certainly no pain. He's had no fever or chills.      Past Medical History:  Diagnosis Date  . Cancer ([place])    thyroid  . GERD (gastroesophageal reflux disease)   . Hypothyroidism   . Lipids blood increased   . Restless leg   . Skin cancer   . Thyroid disease    Past Surgical History:  Procedure Laterality Date  . COLONOSCOPY WITH PROPOFOL N/A 04/05/2015   Procedure: COLONOSCOPY WITH PROPOFOL;  Surgeon: Hulen Luster, MD;  Location: [facility] ENDOSCOPY;  Service: Gastroenterology;  Laterality: N/A;  . throidectomy    . THYROID SURGERY    . TONSILLECTOMY    . varcose veins     History reviewed. No pertinent family history. Social History  Substance Use Topics  . Smoking status: Current Every Day Smoker    Packs/day: 0.25    Types: Cigarettes  . Smokeless tobacco: Never Used  . Alcohol use Yes    Review of Systems  Constitutional: Negative for activity change, chills, fatigue and fever.  Skin: Positive for wound.  All other systems reviewed and are negative.   Allergies  Bee venom  Home Medications   Prior to Admission medications   Medication Sig Start Date End Date Taking? Authorizing Provider  levothyroxine (SYNTHROID, LEVOTHROID) 100 MCG tablet Take 100 mcg by mouth daily before breakfast.   Yes Historical Provider, MD  mupirocin ointment (BACTROBAN) 2 % Apply 1 application topically 3 (three) times daily. 03/04/16  Yes Lorin Picket, PA-C  omeprazole (PRILOSEC) 20 MG capsule Take 20 mg by mouth daily.   Yes Historical Provider, MD  rOPINIRole  (REQUIP) 0.25 MG tablet Take 0.25 mg by mouth once.   Yes Historical Provider, MD  sulfamethoxazole-trimethoprim (BACTRIM DS,SEPTRA DS) 800-160 MG tablet Take 1 tablet by mouth 2 (two) times daily. 03/04/16  Yes Lorin Picket, PA-C  HYDROcodone-acetaminophen (NORCO/VICODIN) 5-325 MG tablet Take 1-2 tablets by mouth every 6 (six) hours as needed. 03/04/16   Lorin Picket, PA-C   Meds Ordered and Administered this Visit  Medications - No data to display  BP 134/83 (BP Location: Left Arm)   Pulse 61   Temp 97.7 F (36.5 C) (Oral)   Resp 17   Ht 6\' 2"  (1.88 m)   Wt 205 lb (93 kg)   SpO2 98%   BMI 26.32 kg/m  No data found.   Physical Exam  Skin: Skin is warm. No erythema.  Examination today shows the wound to be entirely healed. There is no erythema. No drainage is seen. He has a small amount of induration present but no fluctuance is present. Is nontender.  Nursing note and vitals reviewed.   Urgent Care Course   Clinical Course     Procedures (including critical care time)  Labs Review Labs Reviewed - No data to display  Imaging Review No results found.   Visual Acuity Review  Right Eye Distance:   Left Eye Distance:   Bilateral Distance:    Right Eye Near:   Left Eye Near:    Bilateral Near:  MDM   1. Abscess    At this time we will put the patient on warm compress 4 times daily drying the area thoroughly and applying Bactroban ointment over all the area of induration. Was explained in detail to the patient. Will finish off his Septra which he has had a slight amount of diarrhea that has resolved spontaneously. I've asked him to continue the warm compress /Bactroban therapy until the induration has resolved. If he has any further problems he will return to our clinic or he may go see his primary care physician. He may return to full duty at this time.    Lorin Picket, PA-C 03/07/16 1013

## 2016-03-10 NOTE — Progress Notes (Signed)
Discussed results with the patient during his follow-up. No action necessary

## 2016-03-15 ENCOUNTER — Ambulatory Visit
Admission: EM | Admit: 2016-03-15 | Discharge: 2016-03-15 | Disposition: A | Discharge status: Home / Self Care | Payer: PRIVATE HEALTH INSURANCE | Attending: Family Medicine | Admitting: Family Medicine

## 2016-03-15 DIAGNOSIS — Z0289 Encounter for other administrative examinations: Secondary | ICD-10-CM

## 2016-03-15 LAB — DEPT OF TRANSP DIPSTICK, URINE (ARMC ONLY)
BILIRUBIN URINE: NEGATIVE
GLUCOSE, UA: NEGATIVE mg/dL
Hgb urine dipstick: NEGATIVE
Ketones, ur: NEGATIVE mg/dL
LEUKOCYTES UA: NEGATIVE
Nitrite: NEGATIVE
Protein, ur: NEGATIVE mg/dL
Specific Gravity, Urine: 1.02 (ref 1.005–1.030)
pH: 6 (ref 5.0–8.0)

## 2016-03-15 NOTE — ED Triage Notes (Signed)
Patient is here for DOT physical. Patient reports no other symptoms today.

## 2016-03-15 NOTE — ED Provider Notes (Signed)
[facility]    CSN: [id number] Arrival date & time: 03/15/16  [id number]     History   Chief Complaint Chief Complaint  Patient presents with  . Commercial Driver's License Exam    HPI Mattis Perl is a 66 y.o. male.   Patient here for DOT Physical (see scanned form)   The history is provided by the patient.    Past Medical History:  Diagnosis Date  . Cancer ([place])    thyroid  . GERD (gastroesophageal reflux disease)   . Hypothyroidism   . Lipids blood increased   . Restless leg   . Skin cancer   . Thyroid disease     There are no active problems to display for this patient.   Past Surgical History:  Procedure Laterality Date  . COLONOSCOPY WITH PROPOFOL N/A 04/05/2015   Procedure: COLONOSCOPY WITH PROPOFOL;  Surgeon: Hulen Luster, MD;  Location: [facility] ENDOSCOPY;  Service: Gastroenterology;  Laterality: N/A;  . throidectomy    . THYROID SURGERY    . TONSILLECTOMY    . varcose veins         Home Medications    Prior to Admission medications   Medication Sig Start Date End Date Taking? Authorizing Provider  levothyroxine (SYNTHROID, LEVOTHROID) 100 MCG tablet Take 100 mcg by mouth daily before breakfast.   Yes Historical Provider, MD  omeprazole (PRILOSEC) 20 MG capsule Take 20 mg by mouth daily.   Yes Historical Provider, MD  rOPINIRole (REQUIP) 0.25 MG tablet Take 0.25 mg by mouth once.   Yes Historical Provider, MD  HYDROcodone-acetaminophen (NORCO/VICODIN) 5-325 MG tablet Take 1-2 tablets by mouth every 6 (six) hours as needed. 03/04/16   Lorin Picket, PA-C  mupirocin ointment (BACTROBAN) 2 % Apply 1 application topically 3 (three) times daily. 03/04/16   Lorin Picket, PA-C  sulfamethoxazole-trimethoprim (BACTRIM DS,SEPTRA DS) 800-160 MG tablet Take 1 tablet by mouth 2 (two) times daily. 03/04/16   Lorin Picket, PA-C    Family History History reviewed. No pertinent family history.  Social History Social History  Substance Use  Topics  . Smoking status: Current Every Day Smoker    Packs/day: 0.25    Types: Cigarettes  . Smokeless tobacco: Never Used  . Alcohol use Yes     Allergies   Bee venom   Review of Systems Review of Systems   Physical Exam Triage Vital Signs ED Triage Vitals  Enc Vitals Group     BP 03/15/16 1038 136/75     Pulse Rate 03/15/16 1038 (!) 49     Resp 03/15/16 1038 18     Temp 03/15/16 1038 97.8 F (36.6 C)     Temp Source 03/15/16 1038 Oral     SpO2 03/15/16 1038 100 %     Weight 03/15/16 1033 212 lb (96.2 kg)     Height 03/15/16 1033 5\' 11"  (1.803 m)     Head Circumference --      Peak Flow --      Pain Score 03/15/16 1038 0     Pain Loc --      Pain Edu? --      Excl. in [place]? --    No data found.   Updated Vital Signs BP 136/75 (BP Location: Left Arm)   Pulse (!) 49   Temp 97.8 F (36.6 C) (Oral)   Resp 18   Ht 5\' 11"  (1.803 m)   Wt 212 lb (96.2 kg)   SpO2 100%  BMI 29.57 kg/m   Visual Acuity Right Eye Distance: 20/15 (corrected) Left Eye Distance: 20/20 (corrected) Bilateral Distance: 20/15 (corrected)  Right Eye Near:   Left Eye Near:    Bilateral Near:     Physical Exam   UC Treatments / Results  Labs (all labs ordered are listed, but only abnormal results are displayed) Labs Reviewed  DEPT OF TRANSP DIPSTICK, URINE([facility] ONLY)    EKG  EKG Interpretation None       Radiology No results found.  Procedures Procedures (including critical care time)  Medications Ordered in UC Medications - No data to display   Initial Impression / Assessment and Plan / UC Course  I have reviewed the triage vital signs and the nursing notes.  Pertinent labs & imaging results that were available during my care of the patient were reviewed by me and considered in my medical decision making (see chart for details).       Final Clinical Impressions(s) / UC Diagnoses   Final diagnoses:  Encounter for examination required by  Department of Transportation (DOT)    New Prescriptions New Prescriptions   No medications on file   DOT Physical (medically qualified for 1 year; see scanned form)   Norval Gable, MD 03/15/16 1047

## 2016-03-15 NOTE — ED Notes (Signed)
UDS obtained by Hilda Blades, RN with Occupational Health.

## 2016-04-25 ENCOUNTER — Encounter (INDEPENDENT_AMBULATORY_CARE_PROVIDER_SITE_OTHER): Payer: Self-pay | Admitting: Vascular Surgery

## 2016-04-25 ENCOUNTER — Encounter (INDEPENDENT_AMBULATORY_CARE_PROVIDER_SITE_OTHER): Payer: Self-pay

## 2016-04-25 ENCOUNTER — Ambulatory Visit (INDEPENDENT_AMBULATORY_CARE_PROVIDER_SITE_OTHER): Payer: BLUE CROSS/BLUE SHIELD | Admitting: Vascular Surgery

## 2016-04-25 VITALS — BP 131/80 | HR 50 | Resp 17 | Ht 74.0 in | Wt 215.0 lb

## 2016-04-25 DIAGNOSIS — R600 Localized edema: Secondary | ICD-10-CM

## 2016-04-25 DIAGNOSIS — I872 Venous insufficiency (chronic) (peripheral): Secondary | ICD-10-CM | POA: Diagnosis not present

## 2016-04-25 DIAGNOSIS — I83813 Varicose veins of bilateral lower extremities with pain: Secondary | ICD-10-CM

## 2016-04-25 NOTE — Progress Notes (Signed)
Subjective:    Patient ID: [name] [name], male    DOB: [birth year], 66 y.o.   MRN: [id number] Chief Complaint  Patient presents with  . New Patient (Initial Visit)   HPI Presents for evaluation of bilateral lower extremity varicose veins with pain and edema. Patient was last seen a few years ago by Dr. Hulda Humphrey. He endorses a history of bilateral vein stripping when he was approximately 66 years old. He also underwent bilateral laser ablation followed by sclerotherapy by Dr. Hulda Humphrey. Over the last few years, the patient states he has been wearing compression stockings (20-35mmHg) on a daily basis, engages in elevation and has remained active. He has noticed a gradual worsening in the pain especially at night and swelling he is experiencing. States compression and elevation are not controlling his symptoms. Walking makes his symptoms better. Standing worsens his symptoms. Denies any DVT. Denies any ulcer formation.   Review of Systems  Constitutional: Negative.   HENT: Negative.   Eyes: Negative.   Respiratory: Negative.   Cardiovascular: Positive for leg swelling.       Bilateral Venous Insufficiency and Varicose Veins.   Gastrointestinal: Negative.   Endocrine: Negative.   Genitourinary: Negative.   Musculoskeletal: Negative.   Skin: Negative.   Allergic/Immunologic: Negative.   Neurological: Negative.   Hematological: Negative.   Psychiatric/Behavioral: Negative.       Objective:   Physical Exam  Constitutional: He is oriented to person, place, and time. He appears well-developed and well-nourished.  HENT:  Head: Normocephalic and atraumatic.  Right Ear: External ear normal.  Left Ear: External ear normal.  Eyes: Conjunctivae and EOM are normal. Pupils are equal, round, and reactive to light.  Neck: Normal range of motion.  Cardiovascular: Normal rate, regular rhythm, normal heart sounds and intact distal pulses.   Pulses:      Radial pulses are 2+ on the right side, and 2+ on  the left side.       Dorsalis pedis pulses are 2+ on the right side, and 2+ on the left side.       Posterior tibial pulses are 2+ on the right side, and 2+ on the left side.  Bilateral Lower Extremity: Moderate edema noted. Diffuse varicose veins noted bilaterally.   Pulmonary/Chest: Effort normal and breath sounds normal.  Musculoskeletal: Normal range of motion.  Neurological: He is alert and oriented to person, place, and time.  Skin: Skin is warm and dry.  Psychiatric: He has a normal mood and affect. His behavior is normal. Judgment and thought content normal.   BP 131/80   Pulse (!) 50   Resp 17   Ht 6\' 2"  (1.88 m)   Wt 215 lb (97.5 kg)   BMI 27.60 kg/m   Past Medical History:  Diagnosis Date  . Cancer ([place])    thyroid  . GERD (gastroesophageal reflux disease)   . Hypothyroidism   . Lipids blood increased   . Restless leg   . Skin cancer   . Thyroid disease    Social History   Social History  . Marital status: Married    Spouse name: N/A  . Number of children: N/A  . Years of education: N/A   Occupational History  . Not on file.   Social History Main Topics  . Smoking status: Current Every Day Smoker    Packs/day: 0.25    Types: Cigarettes  . Smokeless tobacco: Never Used  . Alcohol use Yes  . Drug use: No  .  Sexual activity: Not Currently   Other Topics Concern  . Not on file   Social History Narrative  . No narrative on file   Past Surgical History:  Procedure Laterality Date  . COLONOSCOPY WITH PROPOFOL N/A 04/05/2015   Procedure: COLONOSCOPY WITH PROPOFOL;  Surgeon: Hulen Luster, MD;  Location: [facility] ENDOSCOPY;  Service: Gastroenterology;  Laterality: N/A;  . throidectomy    . THYROID SURGERY    . TONSILLECTOMY    . varcose veins     Family History  Problem Relation Age of Onset  . Cancer Mother   . Cancer Father     Allergies  Allergen Reactions  . Bee Venom       Assessment & Plan:  Presents for evaluation of bilateral lower  extremity varicose veins with pain and edema. Patient was last seen a few years ago by Dr. Hulda Humphrey. He endorses a history of bilateral vein stripping when he was approximately 66 years old. He also underwent bilateral laser ablation followed by sclerotherapy by Dr. Hulda Humphrey. Over the last few years, the patient states he has been wearing compression stockings (20-83mmHg) on a daily basis, engages in elevation and has remained active. He has noticed a gradual worsening in the pain especially at night and swelling he is experiencing. States compression and elevation are not controlling his symptoms. Walking makes his symptoms better. Standing worsens his symptoms. Denies any DVT. Denies any ulcer formation.  1. Chronic venous insufficiency - Worsening Patient with PMHx of venous disease s/p stripped, laser ablation and now sclerotherapy.  Patient with worsening pain and edema and diffuse varicose veins located on both lower extremities.  Will repeat a bilateral venous duplex to assess anatomy and any worsening venous disease.  Patient to continue compression and elevation.   - VAS [place] LOWER EXTREMITY VENOUS REFLUX; Future  2. Varicose veins of bilateral lower extremities with pain - Worsening Patient with PMHx of venous disease s/p stripped, laser ablation and now sclerotherapy.  Patient with worsening pain and edema and diffuse varicose veins located on both lower extremities.  Will repeat a bilateral venous duplex to assess anatomy and any worsening venous disease.  Patient to continue compression and elevation.   3. Bilateral lower extremity edema - Worsening Patient with PMHx of venous disease s/p stripped, laser ablation and now sclerotherapy.  Patient with worsening pain and edema and diffuse varicose veins located on both lower extremities.  Will repeat a bilateral venous duplex to assess anatomy and any worsening venous disease.  Patient to continue compression and elevation.   Current  Outpatient Prescriptions on File Prior to Visit  Medication Sig Dispense Refill  . levothyroxine (SYNTHROID, LEVOTHROID) 100 MCG tablet Take 100 mcg by mouth daily before breakfast.    . omeprazole (PRILOSEC) 20 MG capsule Take 20 mg by mouth daily.    Marland Kitchen rOPINIRole (REQUIP) 0.25 MG tablet Take 0.25 mg by mouth once.    Marland Kitchen HYDROcodone-acetaminophen (NORCO/VICODIN) 5-325 MG tablet Take 1-2 tablets by mouth every 6 (six) hours as needed. (Patient not taking: Reported on 04/25/2016) 6 tablet 0  . mupirocin ointment (BACTROBAN) 2 % Apply 1 application topically 3 (three) times daily. (Patient not taking: Reported on 04/25/2016) 22 g 0  . sulfamethoxazole-trimethoprim (BACTRIM DS,SEPTRA DS) 800-160 MG tablet Take 1 tablet by mouth 2 (two) times daily. (Patient not taking: Reported on 04/25/2016) 10 tablet 0   No current facility-administered medications on file prior to visit.     There are no Patient Instructions on  file for this visit. Return for Venous Duplex.   Cloy Cozzens A Rubens Cranston, PA-C

## 2016-05-22 ENCOUNTER — Encounter (INDEPENDENT_AMBULATORY_CARE_PROVIDER_SITE_OTHER): Payer: Self-pay | Admitting: Vascular Surgery

## 2016-05-22 ENCOUNTER — Ambulatory Visit (INDEPENDENT_AMBULATORY_CARE_PROVIDER_SITE_OTHER): Payer: BLUE CROSS/BLUE SHIELD

## 2016-05-22 ENCOUNTER — Ambulatory Visit (INDEPENDENT_AMBULATORY_CARE_PROVIDER_SITE_OTHER): Payer: BLUE CROSS/BLUE SHIELD | Admitting: Vascular Surgery

## 2016-05-22 VITALS — BP 123/70 | HR 51 | Resp 17 | Wt 212.0 lb

## 2016-05-22 DIAGNOSIS — I872 Venous insufficiency (chronic) (peripheral): Secondary | ICD-10-CM | POA: Diagnosis not present

## 2016-05-22 DIAGNOSIS — I83813 Varicose veins of bilateral lower extremities with pain: Secondary | ICD-10-CM | POA: Diagnosis not present

## 2016-05-22 NOTE — Progress Notes (Signed)
Subjective:    Patient ID: [name] [name], male    DOB: December 15, 1950, 66 y.o.   MRN: [id number] Chief Complaint  Patient presents with  . Follow-up   Patient last seen on 04/25/16 for evaluation of painful extremities. He is well known to our practice. He endorses a history of laser ablation to the bilateral GSV by Dr. Hulda Humphrey. Even though the patient has worn medical grade one compression, engaged in elevation and has remained active he experiences lower extremity pain and swelling. The discomfort has progressively worsened now requiring OTC anti-inflammatories with minimal relief. He underwent a bilateral lower extremity venous duplex which was notable for bilateral accessory vein reflux. The reflux is the length of the patient leg (bilaterally). He denies any fever, nausea or vomiting.    Review of Systems  Constitutional: Negative.   HENT: Negative.   Eyes: Negative.   Respiratory: Negative.   Cardiovascular: Positive for leg swelling.       Lower Extremity Pain  Gastrointestinal: Negative.   Endocrine: Negative.   Genitourinary: Negative.   Musculoskeletal: Negative.   Skin: Negative.   Allergic/Immunologic: Negative.   Neurological: Negative.   Hematological: Negative.   Psychiatric/Behavioral: Negative.       Objective:   Physical Exam  Constitutional: He is oriented to person, place, and time. He appears well-developed and well-nourished. No distress.  HENT:  Head: Normocephalic and atraumatic.  Eyes: Conjunctivae are normal. Pupils are equal, round, and reactive to light.  Neck: Normal range of motion.  Cardiovascular: Normal rate, regular rhythm, normal heart sounds and intact distal pulses.   Pulses:      Radial pulses are 2+ on the right side, and 2+ on the left side.       Dorsalis pedis pulses are 2+ on the right side, and 2+ on the left side.       Posterior tibial pulses are 2+ on the right side, and 2+ on the left side.  Pulmonary/Chest: Effort normal.    Musculoskeletal: Normal range of motion. He exhibits edema (Moderate Edema. >1cm varicose veins scatterd. ).  Neurological: He is alert and oriented to person, place, and time.  Skin: He is not diaphoretic.  Psychiatric: He has a normal mood and affect. His behavior is normal. Judgment and thought content normal.   BP 123/70   Pulse (!) 51   Resp 17   Wt 212 lb (96.2 kg)   BMI 27.22 kg/m   Past Medical History:  Diagnosis Date  . Cancer ([place])    thyroid  . GERD (gastroesophageal reflux disease)   . Hypothyroidism   . Lipids blood increased   . Restless leg   . Skin cancer   . Thyroid disease    Social History   Social History  . Marital status: Married    Spouse name: N/A  . Number of children: N/A  . Years of education: N/A   Occupational History  . Not on file.   Social History Main Topics  . Smoking status: Current Every Day Smoker    Packs/day: 0.25    Types: Cigarettes  . Smokeless tobacco: Never Used  . Alcohol use Yes  . Drug use: No  . Sexual activity: Not Currently   Other Topics Concern  . Not on file   Social History Narrative  . No narrative on file   Past Surgical History:  Procedure Laterality Date  . COLONOSCOPY WITH PROPOFOL N/A 04/05/2015   Procedure: COLONOSCOPY WITH PROPOFOL;  Surgeon: Lupita Dawn  Candace Cruise, MD;  Location: [facility];  Service: Gastroenterology;  Laterality: N/A;  . throidectomy    . THYROID SURGERY    . TONSILLECTOMY    . varcose veins     Family History  Problem Relation Age of Onset  . Cancer Mother   . Cancer Father    Allergies  Allergen Reactions  . Bee Venom       Assessment & Plan:  Patient last seen on 04/25/16 for evaluation of painful extremities. He is well known to our practice. He endorses a history of laser ablation to the bilateral GSV by Dr. Hulda Humphrey. Even though the patient has worn medical grade one compression, engaged in elevation and has remained active he experiences lower extremity pain and swelling.  The discomfort has progressively worsened now requiring OTC anti-inflammatories with minimal relief. He underwent a bilateral lower extremity venous duplex which was notable for bilateral accessory vein reflux. The reflux is the length of the patient leg (bilaterally). He denies any fever, nausea or vomiting.   1. Venous insufficiency - Worsening Patient with ablated bilateral GSV. Patient has reflux located in accessory vein (the length of his leg bilaterally). Varicose vein are painful and patient is not able to complete his daily activities. He would greatly benefit from foam sclerotherapy in these varicosities.  Will apply and call patient with insurance approval.   2. Varicose veins of bilateral lower extremities with pain - Stable Patient with ablated bilateral GSV. Patient has reflux located in accessory vein (the length of his leg bilaterally). Varicose vein are painful and patient is not able to complete his daily activities. He would greatly benefit from foam sclerotherapy in these varicosities.  Will apply and call patient with insurance approval.  Current Outpatient Prescriptions on File Prior to Visit  Medication Sig Dispense Refill  . levothyroxine (SYNTHROID, LEVOTHROID) 100 MCG tablet Take 100 mcg by mouth daily before breakfast.    . omeprazole (PRILOSEC) 20 MG capsule Take 20 mg by mouth daily.    Marland Kitchen rOPINIRole (REQUIP) 0.25 MG tablet Take 0.25 mg by mouth once.    Marland Kitchen HYDROcodone-acetaminophen (NORCO/VICODIN) 5-325 MG tablet Take 1-2 tablets by mouth every 6 (six) hours as needed. (Patient not taking: Reported on 04/25/2016) 6 tablet 0  . mupirocin ointment (BACTROBAN) 2 % Apply 1 application topically 3 (three) times daily. (Patient not taking: Reported on 04/25/2016) 22 g 0  . sulfamethoxazole-trimethoprim (BACTRIM DS,SEPTRA DS) 800-160 MG tablet Take 1 tablet by mouth 2 (two) times daily. (Patient not taking: Reported on 04/25/2016) 10 tablet 0   No current facility-administered  medications on file prior to visit.     There are no Patient Instructions on file for this visit. No Follow-up on file.   Zakary Kimura A Siddharth Babington, PA-C

## 2016-06-19 IMAGING — NM NM THYROID ABLATION
2 series · 4 of 4 positions shown · non-contrast
Comparison: None.

CLINICAL DATA: 63-year-old male with papillary thyroid carcinoma.
Largest tumor focus 1.3 cm. No extra thyroid extension. Patient
status post total thyroidectomy. Staging at pathology T1b Nx.

EXAM:
NM THYROID ABLATION
TECHNIQUE: The patient received 72 mCi L-XIX sodium iodide for the treatment of
thyroid cancer within the past 10 days. The patient returns today,
and whole body scanning was performed in the anterior and posterior
projections.

[Series 1000: i-(age) post ablation wb scan · 2.40mm/px · 2 of 2 frames shown]
[frame 1/2]
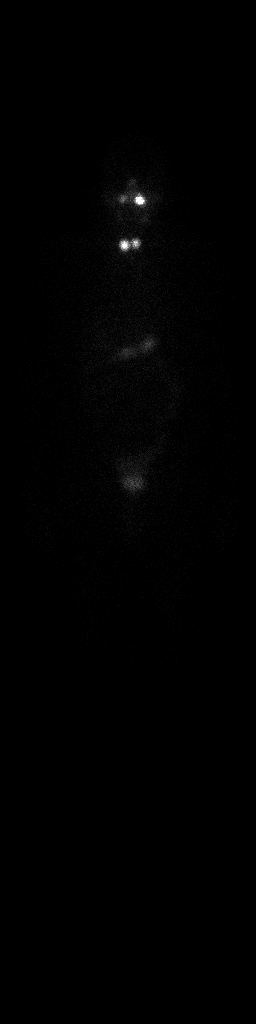
[frame 2/2]
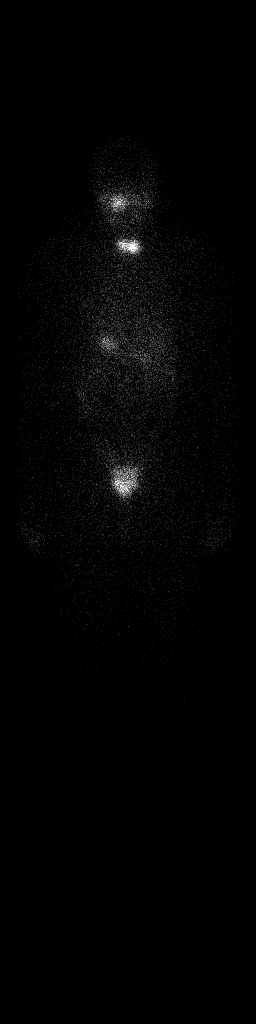

[Series 1000: i-(age) post ablation statics · 2.40mm/px · 2 of 2 frames shown]
[frame 1/2]
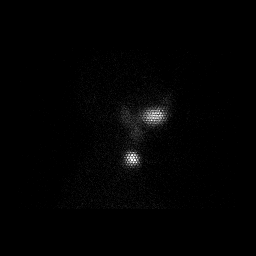
[frame 2/2]
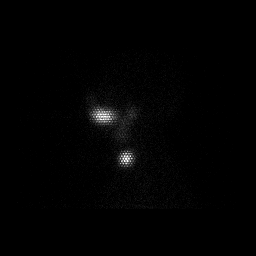

[4 of 4 positions shown; findings below may reference images not displayed]

FINDINGS: There are two foci of activity within thyroid bed most consists with
remnant thyroid tissue. Physiologic uptake in salivary glands, GI
tract and GU tract. No evidence of distant metastatic disease.
IMPRESSION: 1. Expected thyroid remnant activity in the thyroid bed.
2. No evidence distant thyroid cancer metastasis.
